# Patient Record
Sex: Male | Born: 1980 | Race: White | Hispanic: No | Marital: Single | Smoking: Current every day smoker
Health system: Southern US, Community
[De-identification: ages and names within clinical notes are randomized; demographics above are authoritative.]

## PROBLEM LIST (undated history)

## (undated) HISTORY — PX: HAND SURGERY: SHX662

---

## 2018-02-26 ENCOUNTER — Emergency Department (HOSPITAL_BASED_OUTPATIENT_CLINIC_OR_DEPARTMENT_OTHER)
Admission: EM | Admit: 2018-02-26 | Discharge: 2018-02-26 | Disposition: A | Discharge status: Home / Self Care | Payer: Self-pay | Attending: Emergency Medicine | Admitting: Emergency Medicine

## 2018-02-26 ENCOUNTER — Other Ambulatory Visit: Payer: Self-pay

## 2018-02-26 ENCOUNTER — Encounter (HOSPITAL_BASED_OUTPATIENT_CLINIC_OR_DEPARTMENT_OTHER): Payer: Self-pay | Admitting: Emergency Medicine

## 2018-02-26 ENCOUNTER — Emergency Department (HOSPITAL_BASED_OUTPATIENT_CLINIC_OR_DEPARTMENT_OTHER): Payer: Self-pay

## 2018-02-26 DIAGNOSIS — Y929 Unspecified place or not applicable: Secondary | ICD-10-CM | POA: Insufficient documentation

## 2018-02-26 DIAGNOSIS — W260XXA Contact with knife, initial encounter: Secondary | ICD-10-CM | POA: Insufficient documentation

## 2018-02-26 DIAGNOSIS — Y998 Other external cause status: Secondary | ICD-10-CM | POA: Insufficient documentation

## 2018-02-26 DIAGNOSIS — Y9389 Activity, other specified: Secondary | ICD-10-CM | POA: Insufficient documentation

## 2018-02-26 DIAGNOSIS — F17210 Nicotine dependence, cigarettes, uncomplicated: Secondary | ICD-10-CM | POA: Insufficient documentation

## 2018-02-26 DIAGNOSIS — S61412A Laceration without foreign body of left hand, initial encounter: Secondary | ICD-10-CM | POA: Insufficient documentation

## 2018-02-26 MED ORDER — LIDOCAINE-EPINEPHRINE (PF) 2 %-1:200000 IJ SOLN
INTRAMUSCULAR | Status: AC
  Filled 2018-02-26: qty 10

## 2018-02-26 MED ORDER — LIDOCAINE-EPINEPHRINE 2 %-1:100000 IJ SOLN
20.0000 mL | Freq: Once | INTRAMUSCULAR | Status: DC
  Filled 2018-02-26: qty 20

## 2018-02-26 NOTE — ED Notes (Signed)
Patient transported to X-ray 

## 2018-02-26 NOTE — ED Provider Notes (Addendum)
Since seen and evaluated.  Discussed with Dr. Chanetta Marshallimberlake.  Four CM laceration to palmar left hand adjacent thenar eminence.  Normal tendon function.  He reports decreased fine sensation to the radial aspect of his distal ring finger.  Normal radial side sensation.  Normal sensation radially and ulnar to pinky.  Normal FDS and FDP function.  Wound is approximated with 4-0 nylon.  Will be referred to hand surgery for consideration for nerve repair if symptoms do not improve.  This may be simple traumatic neuropraxia.   Rolland PorterJames, Versie Soave, MD 02/26/18 1415    Rolland PorterJames, Ramandeep Arington, MD 02/26/18 1416

## 2018-02-26 NOTE — ED Triage Notes (Signed)
Laceration to left hand after trying to slice something with a knife.

## 2018-02-26 NOTE — Discharge Instructions (Addendum)
Return here or to another doctors office in 5-7 to remove sutures. Do not submerse in water for that time. Use ibuprofen and tylenol for pain.

## 2018-03-01 NOTE — ED Provider Notes (Signed)
[facility] EMERGENCY DEPARTMENT Provider Note   CSN: [id number] Arrival date & time: 02/26/18  1224     History   Chief Complaint Chief Complaint  Patient presents with  . Laceration    HPI James Hanson is a 37 y.o. male.  HPI  Patient presents today immediately after laceration to left palm attempting to cut a zip tie with a pocket knife.  He has cut himself in the past as well.  Tetanus shot is up-to-date.  Hemostatic after placing several gauze.  Of note, family and girlfriend state that he has in the past overuse narcotics.  He repeatedly asks for narcotics and is counseled on appropriateness of this. History reviewed. No pertinent past medical history.  There are no active problems to display for this patient.   History reviewed. No pertinent surgical history.      Home Medications    Prior to Admission medications   Not on File    Family History History reviewed. No pertinent family history.  Social History Social History   Tobacco Use  . Smoking status: Current Every Day Smoker    Packs/day: 0.50    Types: Cigarettes  . Smokeless tobacco: Never Used  Substance Use Topics  . Alcohol use: Yes  . Drug use: Yes    Types: Marijuana     Allergies   Patient has no known allergies.   Review of Systems Review of Systems  Constitutional: Negative for activity change and fever.  Cardiovascular: Negative for chest pain.  Gastrointestinal: Negative for abdominal pain.  Skin: Positive for wound. Negative for rash.     Physical Exam Updated Vital Signs BP 122/79 (BP Location: Right Arm)   Pulse 96   Temp 98.3 F (36.8 C) (Oral)   Resp 16   Ht 6\' 2"  (1.88 m)   Wt 95.3 kg (210 lb)   SpO2 100%   BMI 26.96 kg/m   Physical Exam  Constitutional: He appears well-developed and well-nourished. No distress.  HENT:  Head: Normocephalic and atraumatic.  Eyes: Conjunctivae and EOM are normal.  Neck: Normal range of motion.    Neurological:  Patient states decreased sensation over ulnar aspect of fourth digit of left hand.  Otherwise grip strength and sensation appropriate left hand.  Skin: Skin is warm and dry.  5 cm hemostatic laceration over left hypothenar eminence.  Fatty tissue visualized.  Explored and no foreign body visualized.     ED Treatments / Results  Labs (all labs ordered are listed, but only abnormal results are displayed) Labs Reviewed - No data to display  EKG None  Radiology No results found.  Procedures .Marland Kitchen.Laceration Repair Date/Time: 03/01/2018 4:36 PM Performed by: Garth Bignessimberlake, Taviana Westergren, MD Authorized by: Rolland PorterJames, Mark, MD   Consent:    Consent obtained:  Verbal   Consent given by:  Patient   Risks discussed:  Infection, poor cosmetic result and poor wound healing   Alternatives discussed:  No treatment Anesthesia (see MAR for exact dosages):    Anesthesia method:  Local infiltration   Local anesthetic:  Lidocaine 1% WITH epi Laceration details:    Location:  Hand   Hand location:  L palm   Length (cm):  6   Depth (mm):  5 Repair type:    Repair type:  Simple Pre-procedure details:    Preparation:  Patient was prepped and draped in usual sterile fashion Exploration:    Hemostasis achieved with:  Direct pressure   Wound exploration: entire depth of wound probed  and visualized     Wound extent: no foreign bodies/material noted     Contaminated: no   Treatment:    Area cleansed with:  Betadine   Amount of cleaning:  Standard   Irrigation solution:  Sterile saline   Irrigation method:  Syringe   Visualized foreign bodies/material removed: no   Skin repair:    Repair method:  Sutures   Suture size:  4-0   Suture material:  Prolene   Number of sutures:  6 Approximation:    Approximation:  Close Post-procedure details:    Dressing:  Non-adherent dressing and antibiotic ointment   (including critical care time)  Medications Ordered in ED Medications - No data to  display   Initial Impression / Assessment and Plan / ED Course  I have reviewed the triage vital signs and the nursing notes.  Pertinent labs & imaging results that were available during my care of the patient were reviewed by me and considered in my medical decision making (see chart for details).     Laceration explored without foreign body.  Hemostatic.  Question neuropraxia with decreased sensation over partial aspect of left ring finger.  Follow-up with hand surgery if this is persistent.  Will repair simply.  Repaired with six 4-0 Prolene sutures.  Counseled patient that lacerations are not a narcotic requiring injury and he can use ibuprofen or Tylenol.  Final Clinical Impressions(s) / ED Diagnoses   Final diagnoses:  Laceration of left hand without foreign body, initial encounter    ED Discharge Orders    None     [name], MD PGY 2 FM   [name], MD 03/01/18 1638    [name], MD 03/10/18 1455

## 2018-03-05 ENCOUNTER — Encounter (HOSPITAL_COMMUNITY): Payer: Self-pay

## 2018-03-05 ENCOUNTER — Emergency Department (HOSPITAL_COMMUNITY)
Admission: EM | Admit: 2018-03-05 | Discharge: 2018-03-05 | Disposition: A | Discharge status: Home / Self Care | Payer: Self-pay | Attending: Emergency Medicine | Admitting: Emergency Medicine

## 2018-03-05 ENCOUNTER — Other Ambulatory Visit: Payer: Self-pay

## 2018-03-05 DIAGNOSIS — S61412D Laceration without foreign body of left hand, subsequent encounter: Secondary | ICD-10-CM | POA: Insufficient documentation

## 2018-03-05 DIAGNOSIS — F17210 Nicotine dependence, cigarettes, uncomplicated: Secondary | ICD-10-CM | POA: Insufficient documentation

## 2018-03-05 DIAGNOSIS — Y33XXXD Other specified events, undetermined intent, subsequent encounter: Secondary | ICD-10-CM | POA: Insufficient documentation

## 2018-03-05 DIAGNOSIS — Z4802 Encounter for removal of sutures: Secondary | ICD-10-CM | POA: Insufficient documentation

## 2018-03-05 MED ORDER — CEPHALEXIN 250 MG PO CAPS: 250.0000 mg | ORAL_CAPSULE | Freq: Four times a day (QID) | ORAL | 0 refills | Status: DC

## 2018-03-05 MED ORDER — MELOXICAM 7.5 MG PO TABS: 7.5000 mg | ORAL_TABLET | Freq: Every day | ORAL | 0 refills | Status: DC

## 2018-03-05 NOTE — ED Provider Notes (Signed)
[facility]-EMERGENCY DEPT Provider Note   CSN: [id number] Arrival date & time: 03/05/18  1125     History   Chief Complaint Chief Complaint  Patient presents with  . Suture / Staple Removal    HPI James Hanson is a 37 y.o. male presenting for suture removal.  Patient states that a week ago, he cut his left palm.  Sutures were placed at the time.  Since then, he has been using his hand, and multiple of the sutures have ripped.  The wound is mildly dehisced, but not draining.  He denies fevers or chills.  He reports hand is still throbbing at night, despite Tylenol use.  He has not been using anything else for pain.  He states the numbness has resolved.  He has no other concerns.  He has not followed up with a hand doctor, as his numbness improved.  Pain is intermittent, worse at night.  No difficulty moving his wrist or fingers.  HPI  History reviewed. No pertinent past medical history.  There are no active problems to display for this patient.   Past Surgical History:  Procedure Laterality Date  . HAND SURGERY          Home Medications    Prior to Admission medications   Medication Sig Start Date End Date Taking? Authorizing Provider  cephALEXin (KEFLEX) 250 MG capsule Take 1 capsule (250 mg total) by mouth 4 (four) times daily. 03/05/18   Reshunda Strider, PA-C  meloxicam (MOBIC) 7.5 MG tablet Take 1 tablet (7.5 mg total) by mouth daily. 03/05/18   Theador Jezewski, PA-C    Family History History reviewed. No pertinent family history.  Social History Social History   Tobacco Use  . Smoking status: Current Every Day Smoker    Packs/day: 0.50    Types: Cigarettes  . Smokeless tobacco: Never Used  Substance Use Topics  . Alcohol use: Yes  . Drug use: Not Currently    Types: Marijuana     Allergies   Patient has no known allergies.   Review of Systems Review of Systems  Constitutional: Negative for fever.  Skin: Positive for  wound.  Neurological: Negative for numbness.     Physical Exam Updated Vital Signs BP 129/68 (BP Location: Left Arm)   Pulse 83   Temp (!) 97.5 F (36.4 C) (Oral)   Resp 16   Ht 6\' 2"  (1.88 m)   Wt 95.3 kg (210 lb)   SpO2 100%   BMI 26.96 kg/m   Physical Exam  Constitutional: He is oriented to person, place, and time. He appears well-developed and well-nourished. No distress.  HENT:  Head: Normocephalic and atraumatic.  Eyes: EOM are normal.  Neck: Normal range of motion.  Pulmonary/Chest: Effort normal.  Abdominal: He exhibits no distension.  Musculoskeletal: Normal range of motion. He exhibits no edema or deformity.  Full active range of motion of the wrist and fingers without pain.  Sensation intact.  Good cap refill.  Neurological: He is alert and oriented to person, place, and time. No sensory deficit.  Skin: Skin is warm. No rash noted.  Wound of the left palm. Multiple sutures broken, wound mildly dehisced without drainage. Mild surrounding erythema.  (see picture)  Psychiatric: He has a normal mood and affect.  Nursing note and vitals reviewed.      ED Treatments / Results  Labs (all labs ordered are listed, but only abnormal results are displayed) Labs Reviewed - No data to display  EKG None  Radiology No results found.  Procedures .Suture Removal Date/Time: 03/05/2018 1:19 PM Performed by: Alveria Apley, PA-C Authorized by: Alveria Apley, PA-C   Consent:    Consent obtained:  Verbal   Consent given by:  Patient   Risks discussed:  Wound separation, pain and bleeding Location:    Location:  Upper extremity   Upper extremity location:  Hand   Hand location:  L hand Procedure details:    Wound appearance:  Tender, nonpurulent and red   Number of sutures removed:  6 Post-procedure details:    Post-removal:  Steri-Strips applied   Patient tolerance of procedure:  Tolerated well, no immediate complications   (including critical care  time)  Medications Ordered in ED Medications - No data to display   Initial Impression / Assessment and Plan / ED Course  I have reviewed the triage vital signs and the nursing notes.  Pertinent labs & imaging results that were available during my care of the patient were reviewed by me and considered in my medical decision making (see chart for details).     Patient presenting for suture removal.  Physical exam shows wound that is partially dehisced due to sutures being broken.  Mild surrounding erythema and tenderness.  No drainage.  Numbness has resolved.  Discussed with patient.  Steri-Strips applied to encourage food healing.  Discussed treatment with antibiotics and Mobic for further pain control.  Discussed follow-up with hand for further evaluation.  Discussed patient is to soak his hand in soapy water for 20 minutes multiple times a day to decrease risks of infection.  At this time, patient appears safe for discharge.  Return precautions given.  Patient states he understands and agrees plan.   Final Clinical Impressions(s) / ED Diagnoses   Final diagnoses:  Visit for suture removal    ED Discharge Orders        Ordered    meloxicam (MOBIC) 7.5 MG tablet  Daily     03/05/18 1233    cephALEXin (KEFLEX) 250 MG capsule  4 times daily     03/05/18 1233       Nabil Bubolz, PA-C 03/05/18 1321    Jacalyn Lefevre, MD 03/05/18 1514

## 2018-03-05 NOTE — ED Triage Notes (Signed)
Patient is requesting suture removal from the left palm. Sutures have been intact x 7 days.

## 2018-03-05 NOTE — Discharge Instructions (Addendum)
Take antibiotics as prescribed. Take the entire course, even if your symptoms improve. Take mobic once a day with meals.  You may supplement with Tylenol if you need further pain control. Use ice packs to help with pain and swelling.  Soak your hand in soapy water 2-3 times a day for 230 minutes until skin is closed.  Follow up with the wound care center or the hand doctor if the cut is not healing or continues to cause pain after 1 week.  Return to the ER if you develop fevers, chills, pus draining form the site, or any new or concerning symptoms.

## 2018-03-05 NOTE — ED Notes (Signed)
Bed: WTR6 Expected date:  Expected time:  Means of arrival:  Comments: 

## 2018-04-26 ENCOUNTER — Emergency Department (HOSPITAL_COMMUNITY)
Admission: EM | Admit: 2018-04-26 | Discharge: 2018-04-26 | Disposition: A | Discharge status: Home / Self Care | Payer: No Typology Code available for payment source | Attending: Emergency Medicine | Admitting: Emergency Medicine

## 2018-04-26 ENCOUNTER — Emergency Department (HOSPITAL_COMMUNITY): Payer: No Typology Code available for payment source

## 2018-04-26 ENCOUNTER — Encounter (HOSPITAL_COMMUNITY): Payer: Self-pay | Admitting: Emergency Medicine

## 2018-04-26 DIAGNOSIS — S0990XA Unspecified injury of head, initial encounter: Secondary | ICD-10-CM | POA: Diagnosis present

## 2018-04-26 DIAGNOSIS — S161XXA Strain of muscle, fascia and tendon at neck level, initial encounter: Secondary | ICD-10-CM | POA: Diagnosis not present

## 2018-04-26 DIAGNOSIS — M79662 Pain in left lower leg: Secondary | ICD-10-CM | POA: Insufficient documentation

## 2018-04-26 DIAGNOSIS — Z23 Encounter for immunization: Secondary | ICD-10-CM | POA: Insufficient documentation

## 2018-04-26 DIAGNOSIS — S39012A Strain of muscle, fascia and tendon of lower back, initial encounter: Secondary | ICD-10-CM | POA: Insufficient documentation

## 2018-04-26 DIAGNOSIS — Z79899 Other long term (current) drug therapy: Secondary | ICD-10-CM | POA: Diagnosis not present

## 2018-04-26 DIAGNOSIS — Y939 Activity, unspecified: Secondary | ICD-10-CM | POA: Diagnosis not present

## 2018-04-26 DIAGNOSIS — Y929 Unspecified place or not applicable: Secondary | ICD-10-CM | POA: Insufficient documentation

## 2018-04-26 DIAGNOSIS — S060X0A Concussion without loss of consciousness, initial encounter: Secondary | ICD-10-CM | POA: Insufficient documentation

## 2018-04-26 DIAGNOSIS — F17210 Nicotine dependence, cigarettes, uncomplicated: Secondary | ICD-10-CM | POA: Diagnosis not present

## 2018-04-26 DIAGNOSIS — T148XXA Other injury of unspecified body region, initial encounter: Secondary | ICD-10-CM

## 2018-04-26 DIAGNOSIS — R52 Pain, unspecified: Secondary | ICD-10-CM

## 2018-04-26 DIAGNOSIS — Y999 Unspecified external cause status: Secondary | ICD-10-CM | POA: Diagnosis not present

## 2018-04-26 DIAGNOSIS — S8990XA Unspecified injury of unspecified lower leg, initial encounter: Secondary | ICD-10-CM

## 2018-04-26 LAB — BASIC METABOLIC PANEL
Anion gap: 10 (ref 5–15)
BUN: 17 mg/dL (ref 6–20)
CO2: 28 mmol/L (ref 22–32)
Calcium: 9.6 mg/dL (ref 8.9–10.3)
Chloride: 104 mmol/L (ref 98–111)
Creatinine, Ser: 1.38 mg/dL — ABNORMAL HIGH (ref 0.61–1.24)
GFR calc Af Amer: >60 mL/min (ref >60)
Glucose, Bld: 100 mg/dL — ABNORMAL HIGH (ref 70–99)
POTASSIUM: 3.7 mmol/L (ref 3.5–5.1)
SODIUM: 142 mmol/L (ref 135–145)

## 2018-04-26 LAB — RAPID URINE DRUG SCREEN, HOSP PERFORMED
AMPHETAMINES: POSITIVE — AB
Barbiturates: NOT DETECTED
Benzodiazepines: POSITIVE — AB
Cocaine: NOT DETECTED
Opiates: POSITIVE — AB
TETRAHYDROCANNABINOL: NOT DETECTED

## 2018-04-26 LAB — CBC
HCT: 47.7 % (ref 39.0–52.0)
HEMOGLOBIN: 15.1 g/dL (ref 13.0–17.0)
MCH: 29.4 pg (ref 26.0–34.0)
MCHC: 31.7 g/dL (ref 30.0–36.0)
MCV: 92.8 fL (ref 78.0–100.0)
Platelets: 184 10*3/uL (ref 150–400)
RBC: 5.14 MIL/uL (ref 4.22–5.81)
RDW: 13.2 % (ref 11.5–15.5)
WBC: 9.8 10*3/uL (ref 4.0–10.5)

## 2018-04-26 LAB — ETHANOL

## 2018-04-26 MED ORDER — TETANUS-DIPHTH-ACELL PERTUSSIS 5-2.5-18.5 LF-MCG/0.5 IM SUSP
0.5000 mL | Freq: Once | INTRAMUSCULAR | Status: AC
  Administered 2018-04-26: 0.5 mL via INTRAMUSCULAR
  Filled 2018-04-26: qty 0.5

## 2018-04-26 MED ORDER — FENTANYL CITRATE (PF) 100 MCG/2ML IJ SOLN
50.0000 ug | Freq: Once | INTRAMUSCULAR | Status: AC
  Administered 2018-04-26: 50 ug via INTRAVENOUS
  Filled 2018-04-26: qty 2
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
## 2018-04-26 NOTE — ED Notes (Signed)
Patient transported to X-ray 

## 2018-04-26 NOTE — ED Notes (Signed)
Pt requested RN dial the phone b/c it was not working, RN found that he was trying to use the Call button to call.  RN assisted pt and was able to reach his mother.  Pt requested RN explain to mother on phone the situation which she did.
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
## 2018-10-15 DEATH — deceased
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
